# Patient Record
Sex: Female | Born: 1985
Health system: Southern US, Community
[De-identification: ages and names within clinical notes are randomized; demographics above are authoritative.]

## PROBLEM LIST (undated history)

## (undated) DIAGNOSIS — F1010 Alcohol abuse, uncomplicated: Secondary | ICD-10-CM

## (undated) DIAGNOSIS — I10 Essential (primary) hypertension: Secondary | ICD-10-CM

## (undated) DIAGNOSIS — K589 Irritable bowel syndrome without diarrhea: Secondary | ICD-10-CM

## (undated) DIAGNOSIS — F1910 Other psychoactive substance abuse, uncomplicated: Secondary | ICD-10-CM

## (undated) DIAGNOSIS — E119 Type 2 diabetes mellitus without complications: Secondary | ICD-10-CM

## (undated) DIAGNOSIS — F909 Attention-deficit hyperactivity disorder, unspecified type: Secondary | ICD-10-CM

## (undated) HISTORY — PX: LEEP: SHX91

## (undated) HISTORY — PX: APPENDECTOMY: SHX54

---
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
## 2005-02-17 ENCOUNTER — Inpatient Hospital Stay (HOSPITAL_COMMUNITY): Admission: AD | Admit: 2005-02-17 | Discharge: 2005-02-17 | Payer: Self-pay | Admitting: Family Medicine

## 2005-03-26 ENCOUNTER — Inpatient Hospital Stay (HOSPITAL_COMMUNITY): Admission: AD | Admit: 2005-03-26 | Discharge: 2005-03-26 | Payer: Self-pay | Admitting: Family Medicine

## 2005-04-04 ENCOUNTER — Other Ambulatory Visit: Admission: RE | Admit: 2005-04-04 | Discharge: 2005-04-04 | Payer: Self-pay | Admitting: Obstetrics and Gynecology

## 2005-07-24 ENCOUNTER — Inpatient Hospital Stay (HOSPITAL_COMMUNITY): Admission: AD | Admit: 2005-07-24 | Discharge: 2005-07-25 | Payer: Self-pay | Admitting: Obstetrics and Gynecology

## 2005-08-16 ENCOUNTER — Inpatient Hospital Stay (HOSPITAL_COMMUNITY): Admission: AD | Admit: 2005-08-16 | Discharge: 2005-08-16 | Payer: Self-pay | Admitting: Obstetrics and Gynecology

## 2005-08-29 ENCOUNTER — Emergency Department (HOSPITAL_COMMUNITY): Admission: EM | Admit: 2005-08-29 | Discharge: 2005-08-29 | Payer: Self-pay | Admitting: Emergency Medicine

## 2005-09-19 ENCOUNTER — Inpatient Hospital Stay (HOSPITAL_COMMUNITY): Admission: AD | Admit: 2005-09-19 | Discharge: 2005-09-21 | Payer: Self-pay | Admitting: Obstetrics and Gynecology

## 2005-10-30 ENCOUNTER — Other Ambulatory Visit: Admission: RE | Admit: 2005-10-30 | Discharge: 2005-10-30 | Payer: Self-pay | Admitting: Obstetrics and Gynecology

## 2006-09-05 ENCOUNTER — Other Ambulatory Visit: Admission: RE | Admit: 2006-09-05 | Discharge: 2006-09-05 | Payer: Self-pay | Admitting: Obstetrics and Gynecology

## 2006-09-26 ENCOUNTER — Inpatient Hospital Stay (HOSPITAL_COMMUNITY): Admission: AD | Admit: 2006-09-26 | Discharge: 2006-09-26 | Payer: Self-pay | Admitting: Obstetrics and Gynecology

## 2006-09-26 ENCOUNTER — Emergency Department (HOSPITAL_COMMUNITY): Admission: EM | Admit: 2006-09-26 | Discharge: 2006-09-26 | Payer: Self-pay | Admitting: Family Medicine

## 2006-12-31 ENCOUNTER — Inpatient Hospital Stay (HOSPITAL_COMMUNITY): Admission: AD | Admit: 2006-12-31 | Discharge: 2006-12-31 | Payer: Self-pay | Admitting: Obstetrics and Gynecology

## 2007-04-24 ENCOUNTER — Inpatient Hospital Stay (HOSPITAL_COMMUNITY): Admission: AD | Admit: 2007-04-24 | Discharge: 2007-04-25 | Payer: Self-pay | Admitting: Obstetrics and Gynecology

## 2007-04-30 ENCOUNTER — Inpatient Hospital Stay (HOSPITAL_COMMUNITY): Admission: RE | Admit: 2007-04-30 | Discharge: 2007-05-02 | Payer: Self-pay | Admitting: Obstetrics and Gynecology

## 2009-02-09 ENCOUNTER — Inpatient Hospital Stay (HOSPITAL_COMMUNITY): Admission: AD | Admit: 2009-02-09 | Discharge: 2009-02-09 | Payer: Self-pay | Admitting: Obstetrics and Gynecology

## 2009-02-15 ENCOUNTER — Inpatient Hospital Stay (HOSPITAL_COMMUNITY): Admission: AD | Admit: 2009-02-15 | Discharge: 2009-02-15 | Payer: Self-pay | Admitting: Obstetrics and Gynecology

## 2011-02-13 LAB — COMPREHENSIVE METABOLIC PANEL
AST: 19 U/L (ref 0–37)
Albumin: 4.1 g/dL (ref 3.5–5.2)
CO2: 25 mEq/L (ref 19–32)
Calcium: 9.4 mg/dL (ref 8.4–10.5)
Creatinine, Ser: 0.68 mg/dL (ref 0.4–1.2)
GFR calc Af Amer: >60 mL/min (ref >60)
GFR calc non Af Amer: >60 mL/min (ref >60)

## 2011-02-13 LAB — CBC
MCHC: 33 g/dL (ref 30.0–36.0)
MCV: 84.6 fL (ref 78.0–100.0)
Platelets: 277 10*3/uL (ref 150–400)

## 2011-02-13 LAB — HCG, QUANTITATIVE, PREGNANCY
hCG, Beta Chain, Quant, S: 251 m[IU]/mL — ABNORMAL HIGH (ref <5)
hCG, Beta Chain, Quant, S: 761 m[IU]/mL — ABNORMAL HIGH (ref <5)

## 2011-02-13 LAB — DIFFERENTIAL
Eosinophils Relative: 2 % (ref 0–5)
Lymphocytes Relative: 41 % (ref 12–46)
Lymphs Abs: 2.3 10*3/uL (ref 0.7–4.0)
Neutro Abs: 2.9 10*3/uL (ref 1.7–7.7)

## 2011-03-22 NOTE — Discharge Summary (Signed)
Janice Pierce, Janice Pierce          ACCOUNT NO.:  [id number]   MEDICAL RECORD NO.:  [id number]          PATIENT TYPE:  INP   LOCATION:  9121                          FACILITY:  [facility]   PHYSICIAN:  James A. Ashley Royalty, M.D.DATE OF BIRTH:  1986/08/05   DATE OF ADMISSION:  09/19/2005  DATE OF DISCHARGE:  09/21/2005                                 DISCHARGE SUMMARY   DISCHARGE DIAGNOSES:  1.  Intrauterine pregnancy at term, delivered.  2.  A-negative blood type.  3.  Term birth, living child, vertex.   OPERATIONS AND SPECIAL PROCEDURES:  OB delivery.   CONSULTATIONS:  None.   DISCHARGE MEDICATIONS:  Motrin 600 mg q.i.d. p.r.n.   HISTORY AND PHYSICAL:  This is a 25 year old primigravida, 37 weeks 1 day  gestation.  Patient is A-negative blood type and received RhoGAM at or about  28 weeks.  She presented to Maternity Admissions complaining of bleeding  like her period.  She was noted to be in labor with a cervical examination  of 3+ cm dilatation, 80% effaced, -1 to -2 station.  For the remainder of  the history and physical, please see chart.   HOSPITAL COURSE:  The patient was admitted to the [facility] at  [place].  Admission laboratory studies were drawn.  Artificial rupture of  membranes was accomplished.  The patient went on to labor and deliver on  September 20, 2005.  The delivery was accomplished by Dr. Gerald Leitz over an  intact perineum.  The infant was a 7 pound 14 ounce female.  Apgars 8 at one  minute, 9 at five minutes, sent to the newborn nursery.  The patient's  postpartum course was benign.  She requested discharge on the first  postpartum day and was granted same.  The discharge instructions were given.   DISPOSITION:  The patient is to return to [facility] and Obstetrics  in four to six weeks for postpartum evaluation.      James A. Ashley Royalty, M.D.  Electronically Signed     JAM/MEDQ  D:  10/24/2005  T:  10/24/2005  Job:  [id number]

## 2011-03-22 NOTE — Discharge Summary (Signed)
Janice Pierce, Janice Pierce             ACCOUNT NO.:  [id number]   MEDICAL RECORD NO.:  [id number]          PATIENT TYPE:  INP   LOCATION:  9123                          [facility]ILITY:  [facility]   PHYSICIAN:  James A. Ashley Royalty, M.D.DATE OF BIRTH:  1986-07-22   DATE OF ADMISSION:  04/30/2007  DATE OF DISCHARGE:  05/02/2007                               DISCHARGE SUMMARY   DISCHARGE DIAGNOSIS:  1. Intrauterine pregnancy at term, delivered.  2. Term birth of living child, vertex.  3. Large for gestational age infant.  4. Shoulder dystocia.   OPERATIONS AND SPECIAL PROCEDURES:  OB delivery with episiotomy (after  shoulder dystocia), episiorrhaphy.   CONSULTATIONS:  None.   DISCHARGE MEDICATIONS:  Percocet, Motrin 600 mg.   HISTORY AND PHYSICAL:  This is a 25 year old gravida 2, para 1 at [redacted]  weeks gestation.  Prenatal care was complicated by bipolar disorder, A  negative blood type, and large for gestational age infant.  Ultrasound  on April 23, 2007 revealed an estimated fetal weight of 4046 grams.  AFI  was 23.8.  The patient was admitted for induction secondary to LGA and  advanced cervical changes at term.  For the remainder of the history and  physical, please see chart.   HOSPITAL COURSE:  The patient was admitted to [facility]  [place].  Admission laboratory studies were drawn.  She was given  Pitocin, and artificial rupture of membranes was accomplished.  The  patient went on to labor and deliver on April 30, 2007.  The infant was a  9 pound 7 ounce female with Apgars 8 at 1 minute and 9 at 5 minutes, sent  to the newborn nursery.  Delivery was complicated by shoulder dystocia  which was relieved with the appropriate maneuvers.  The patient's  postpartum course was benign.  She was discharged on the second  postpartum day afebrile and in satisfactory condition.   DISPOSITION:  The patient is to return to [facility] and  Obstetrics in approximately 6 weeks for  postpartum evaluation.      James A. Ashley Royalty, M.D.  Electronically Signed     JAM/MEDQ  D:  07/09/2007  T:  07/09/2007  Job:  [id number]

## 2011-08-21 LAB — CBC
HCT: 30.7 — ABNORMAL LOW
Hemoglobin: 10.2 — ABNORMAL LOW
MCV: 76.5 — ABNORMAL LOW
RBC: 3.97
RBC: 4.26
RDW: 15.3 — ABNORMAL HIGH
WBC: 11.6 — ABNORMAL HIGH
WBC: 8.7

## 2011-08-21 LAB — RPR: RPR Ser Ql: NONREACTIVE

## 2015-06-16 ENCOUNTER — Emergency Department (HOSPITAL_COMMUNITY)
Admission: EM | Admit: 2015-06-16 | Discharge: 2015-06-16 | Disposition: A | Discharge status: Home / Self Care | Payer: Self-pay | Attending: Emergency Medicine | Admitting: Emergency Medicine

## 2015-06-16 ENCOUNTER — Emergency Department (HOSPITAL_COMMUNITY): Payer: Self-pay

## 2015-06-16 ENCOUNTER — Encounter (HOSPITAL_COMMUNITY): Payer: Self-pay | Admitting: Emergency Medicine

## 2015-06-16 DIAGNOSIS — M25561 Pain in right knee: Secondary | ICD-10-CM

## 2015-06-16 DIAGNOSIS — Y998 Other external cause status: Secondary | ICD-10-CM | POA: Insufficient documentation

## 2015-06-16 DIAGNOSIS — Z91040 Latex allergy status: Secondary | ICD-10-CM | POA: Insufficient documentation

## 2015-06-16 DIAGNOSIS — X58XXXA Exposure to other specified factors, initial encounter: Secondary | ICD-10-CM | POA: Insufficient documentation

## 2015-06-16 DIAGNOSIS — Z8719 Personal history of other diseases of the digestive system: Secondary | ICD-10-CM | POA: Insufficient documentation

## 2015-06-16 DIAGNOSIS — E119 Type 2 diabetes mellitus without complications: Secondary | ICD-10-CM | POA: Insufficient documentation

## 2015-06-16 DIAGNOSIS — Y92009 Unspecified place in unspecified non-institutional (private) residence as the place of occurrence of the external cause: Secondary | ICD-10-CM | POA: Insufficient documentation

## 2015-06-16 DIAGNOSIS — Z720 Tobacco use: Secondary | ICD-10-CM | POA: Insufficient documentation

## 2015-06-16 DIAGNOSIS — Z8659 Personal history of other mental and behavioral disorders: Secondary | ICD-10-CM | POA: Insufficient documentation

## 2015-06-16 DIAGNOSIS — S8001XA Contusion of right knee, initial encounter: Secondary | ICD-10-CM | POA: Insufficient documentation

## 2015-06-16 DIAGNOSIS — Y9389 Activity, other specified: Secondary | ICD-10-CM | POA: Insufficient documentation

## 2015-06-16 HISTORY — DX: Attention-deficit hyperactivity disorder, unspecified type: F90.9

## 2015-06-16 HISTORY — DX: Type 2 diabetes mellitus without complications: E11.9

## 2015-06-16 HISTORY — DX: Irritable bowel syndrome, unspecified: K58.9

## 2015-06-16 MED ORDER — NAPROXEN 500 MG PO TABS: 500.0000 mg | ORAL_TABLET | Freq: Two times a day (BID) | ORAL | Status: DC | PRN

## 2015-06-16 MED ORDER — HYDROCODONE-ACETAMINOPHEN 5-325 MG PO TABS
1.0000 | ORAL_TABLET | Freq: Once | ORAL | Status: AC
  Administered 2015-06-16: 1 via ORAL
  Filled 2015-06-16: qty 1

## 2015-06-16 MED ORDER — HYDROCODONE-ACETAMINOPHEN 5-325 MG PO TABS: 1.0000 | ORAL_TABLET | Freq: Four times a day (QID) | ORAL | Status: DC | PRN

## 2015-06-16 NOTE — ED Notes (Signed)
Per EMS pt at [employer] with children felt pop/pain to right knee post getting off of bouncer with children.

## 2015-06-16 NOTE — ED Provider Notes (Signed)
CSN: [id number]     Arrival date & time 06/16/15  1810 History  This chart was scribed for Richardean Canal, MD by Littie Deeds, ED Scribe. This patient was seen in room WTR6/WTR6 and the patient's care was started at 6:50 PM.       Chief Complaint  Patient presents with  . Knee Pain   Patient is a 29 y.o. female presenting with knee pain. The history is provided by the patient. No language interpreter was used.  Knee Pain Location:  Knee Time since incident:  1 hour Injury: yes   Mechanism of injury comment:  Foot slid out and knee bent sideways Knee location:  R knee Pain details:    Quality:  Throbbing and shooting   Radiates to:  R leg   Severity:  Severe   Onset quality:  Sudden   Duration:  1 hour   Timing:  Constant   Progression:  Unchanged Chronicity:  New Dislocation: no   Prior injury to area:  No Relieved by:  None tried Worsened by:  Bearing weight Ineffective treatments:  None tried Associated symptoms: decreased ROM (due to pain), swelling and tingling (in lower R leg)   Associated symptoms: no back pain, no muscle weakness, no neck pain and no numbness    HPI Comments: Janice Pierce is a 29 y.o. female who presents to the Emergency Department complaining of sudden onset, constant, shooting, throbbing right knee pain radiating down her leg intermittently that occurred prior to arrival. Patient states she was in a bounce house when her right foot slid outwards and caused her knee to bend sideways; she felt a pop when this happened. The pain is described as a 10/10 in severity. The pain is worsened with movement of her leg and foot. No tx tried PTA. She does not have any abrasions, but she does have bruising and swelling to the R knee. She reports having associated tingling to the R lower leg. Patient denies head injury and LOC, chest pain, SOB, headache, nausea, vomiting, abdominal pain, neck pain, back pain, numbness, focal weakness, or any other injuries.   Past  Medical History  Diagnosis Date  . Diabetes mellitus without complication   . IBS (irritable bowel syndrome)   . ADHD (attention deficit hyperactivity disorder)    History reviewed. No pertinent past surgical history. No family history on file. Social History  Substance Use Topics  . Smoking status: Current Every Day Smoker -- 0.50 packs/day    Types: Cigarettes  . Smokeless tobacco: None  . Alcohol Use: Yes     Comment: social   OB History    No data available     Review of Systems  HENT: Negative for facial swelling (no head inj).   Respiratory: Negative for shortness of breath.   Cardiovascular: Negative for chest pain.  Gastrointestinal: Negative for nausea, vomiting and abdominal pain.  Musculoskeletal: Positive for joint swelling (R knee) and arthralgias (R knee). Negative for back pain and neck pain.  Skin: Positive for color change (bruising to R knee). Negative for wound.  Allergic/Immunologic: Negative for immunocompromised state.  Neurological: Negative for syncope and headaches.  10 Systems reviewed and are negative for acute change except as noted in the HPI.     Allergies  Latex  Home Medications   Prior to Admission medications   Not on File   BP 153/100 mmHg  Pulse 80  Temp(Src) 98.5 F (36.9 C) (Oral)  Resp 22  SpO2 100% Physical Exam  Constitutional: She is oriented to person, place, and time. Vital signs are normal. She appears well-developed and well-nourished.  Non-toxic appearance. She appears distressed.  Afebrile, nontoxic, appears uncomfortable and is tearful during exam  HENT:  Head: Normocephalic and atraumatic.  Mouth/Throat: Mucous membranes are normal.  Eyes: Conjunctivae and EOM are normal. Right eye exhibits no discharge. Left eye exhibits no discharge.  Neck: Normal range of motion. Neck supple.  Cardiovascular: Normal rate and intact distal pulses.   Pulmonary/Chest: Effort normal. No respiratory distress.  Abdominal: Normal  appearance. She exhibits no distension.  Musculoskeletal:       Right knee: She exhibits decreased range of motion (due to pain), swelling, ecchymosis and bony tenderness. She exhibits no deformity, no laceration, no erythema, normal alignment, no LCL laxity, normal patellar mobility and no MCL laxity. Tenderness found. Medial joint line and lateral joint line tenderness noted.       Right lower leg: She exhibits tenderness and bony tenderness. She exhibits no swelling, no deformity and no laceration.       Legs: R knee with limited ROM due to pain, mild moderate medial and lateral joint line TTP extending into anterior tibia area of lower leg, slight swelling to R knee noted, no deformity, small amount of bruising noted laterally along joint line, no erythema or warmth, no abnormal alignment or patellar mobility, no varus/valgus laxity, neg anterior drawer test, no crepitus. Strength slightly diminished due to pain. Sensation grossly intact, wiggles toes without difficulty, distal pulses intact. R ankle nonTTP without deformities or swelling.    Neurological: She is alert and oriented to person, place, and time. She has normal strength. No sensory deficit.  Skin: Skin is warm, dry and intact. Bruising noted. No abrasion and no rash noted.  Small bruise to R knee. No abrasions over all exposed surfaces  Psychiatric: She has a normal mood and affect. Her behavior is normal.  Nursing note and vitals reviewed.   ED Course  Procedures  DIAGNOSTIC STUDIES: Oxygen Saturation is 100% on room air, normal by my interpretation.    COORDINATION OF CARE: 6:55 PM-Discussed treatment plan which includes XR imaging and pain medication with patient/guardian at bedside and patient/guardian agreed to plan.    Labs Review Labs Reviewed - No data to display  Imaging Review No results found. I personally reviewed and evaluated these images and lab results as part of my medical decision-making.   EKG  Interpretation None      MDM   Final diagnoses:  Right knee pain    29 y.o. female here with R knee pain, swelling, and bruising after her leg bent out from under her just PTA. Slight swelling and bruising noted, with tenderness to jointline bilaterally. Mild tenderness to tibia anteriorly. Will obtain xray imaging. NVI with soft compartments. No ankle tenderness. Will give pain meds and reassess shortly.   8:01 PM Xray still pending. Elsie Stain will follow up with this, please see her note for further documentation.  I personally performed the services described in this documentation, which was scribed in my presence. The recorded information has been reviewed and is accurate.  BP 153/100 mmHg  Pulse 80  Temp(Src) 98.5 F (36.9 C) (Oral)  Resp 22  SpO2 100%  Meds ordered this encounter  Medications  . HYDROcodone-acetaminophen (NORCO/VICODIN) 5-325 MG per tablet 1 tablet    Sig:   . naproxen (NAPROSYN) 500 MG tablet    Sig: Take 1 tablet (500 mg total) by mouth 2 (two)  times daily as needed for mild pain, moderate pain or headache (TAKE WITH MEALS.).    Dispense:  20 tablet    Refill:  0    Order Specific Question:  Supervising Provider    Answer:  MILLER, BRIAN [[id number]]  . HYDROcodone-acetaminophen (NORCO) 5-325 MG per tablet    Sig: Take 1 tablet by mouth every 6 (six) hours as needed for severe pain.    Dispense:  10 tablet    Refill:  0    Order Specific Question:  Supervising Provider    Answer:  Eber Hong [[id number]]     Brittan Mapel Camprubi-Soms, PA-C 06/16/15 2002  Richardean Canal, MD 06/16/15 2314

## 2015-06-16 NOTE — Discharge Instructions (Signed)
Wear knee immobilizer for at least 2 weeks for stabilization of knee. Use crutches as needed for comfort. Ice and elevate knee throughout the day. Alternate between naprosyn and norco for pain relief. Do not drive or operate machinery with pain medication use. Call orthopedic follow up today or tomorrow to schedule followup appointment for recheck of ongoing knee pain in one week. Return to the ER for changes or worsening symptoms.   Knee Pain Knee pain can be a result of an injury or other medical conditions. Treatment will depend on the cause of your pain. HOME CARE  Only take medicine as told by your doctor.  Keep a healthy weight. Being overweight can make the knee hurt more.  Stretch before exercising or playing sports.  If there is constant knee pain, change the way you exercise. Ask your doctor for advice.  Make sure shoes fit well. Choose the right shoe for the sport or activity.  Protect your knees. Wear kneepads if needed.  Rest when you are tired. GET HELP RIGHT AWAY IF:   Your knee pain does not stop.  Your knee pain does not get better.  Your knee joint feels hot to the touch.  You have a fever. MAKE SURE YOU:   Understand these instructions.  Will watch this condition.  Will get help right away if you are not doing well or get worse. Document Released: 01/17/2009 Document Revised: 01/13/2012 Document Reviewed: 01/17/2009 [facility] Patient Information 2015 [place], [place]. This information is not intended to replace advice given to you by your health care provider. Make sure you discuss any questions you have with your health care provider.  Cryotherapy Cryotherapy is when you put ice on your injury. Ice helps lessen pain and puffiness (swelling) after an injury. Ice works the best when you start using it in the first 24 to 48 hours after an injury. HOME CARE  Put a dry or damp towel between the ice pack and your skin.  You may press gently on the ice  pack.  Leave the ice on for no more than 10 to 20 minutes at a time.  Check your skin after 5 minutes to make sure your skin is okay.  Rest at least 20 minutes between ice pack uses.  Stop using ice when your skin loses feeling (numbness).  Do not use ice on someone who cannot tell you when it hurts. This includes small children and people with memory problems (dementia). GET HELP RIGHT AWAY IF:  You have white spots on your skin.  Your skin turns blue or pale.  Your skin feels waxy or hard.  Your puffiness gets worse. MAKE SURE YOU:   Understand these instructions.  Will watch your condition.  Will get help right away if you are not doing well or get worse. Document Released: 04/08/2008 Document Revised: 01/13/2012 Document Reviewed: 06/13/2011 [facility] Patient Information 2015 [place], [place]. This information is not intended to replace advice given to you by your health care provider. Make sure you discuss any questions you have with your health care provider.   Your xray is normal

## 2020-08-16 ENCOUNTER — Ambulatory Visit: Payer: BLUE CROSS/BLUE SHIELD

## 2020-08-16 ENCOUNTER — Other Ambulatory Visit: Payer: Self-pay

## 2020-08-16 ENCOUNTER — Ambulatory Visit
Admission: EM | Admit: 2020-08-16 | Discharge: 2020-08-16 | Disposition: A | Discharge status: Home / Self Care | Payer: BLUE CROSS/BLUE SHIELD

## 2020-08-16 ENCOUNTER — Encounter: Payer: Self-pay | Admitting: Emergency Medicine

## 2020-08-16 ENCOUNTER — Ambulatory Visit (INDEPENDENT_AMBULATORY_CARE_PROVIDER_SITE_OTHER): Payer: BLUE CROSS/BLUE SHIELD

## 2020-08-16 DIAGNOSIS — S62396A Other fracture of fifth metacarpal bone, right hand, initial encounter for closed fracture: Secondary | ICD-10-CM | POA: Diagnosis not present

## 2020-08-16 DIAGNOSIS — M79641 Pain in right hand: Secondary | ICD-10-CM

## 2020-08-16 MED ORDER — IBUPROFEN 800 MG PO TABS: 800.0000 mg | ORAL_TABLET | Freq: Three times a day (TID) | ORAL | 0 refills | Status: AC

## 2020-08-16 NOTE — ED Provider Notes (Signed)
[facility]   [id number] 08/16/20 Arrival Time: 1656   Chief Complaint  Patient presents with  . Hand Pain     SUBJECTIVE: History from: patient and family.  Janice Pierce is a 34 y.o. female presented to the urgent care with a complaint of right hand pain that occurred 4 days ago.  Reported she hit the wall today and get her symptom worse. She localizes the pain to the right hand.  She describes the pain as constant and achy.  She has tried OTC medications without relief.  Her symptoms are made worse with ROM.  She denies similar symptoms in the past.  Denies chills, fever, nausea, vomiting, diarrhea.   ROS: As per HPI.  All other pertinent ROS negative.     Past Medical History:  Diagnosis Date  . ADHD (attention deficit hyperactivity disorder)   . Diabetes mellitus without complication (HCC)   . IBS (irritable bowel syndrome)    History reviewed. No pertinent surgical history. Allergies  Allergen Reactions  . Latex Hives   No current facility-administered medications on file prior to encounter.   Current Outpatient Medications on File Prior to Encounter  Medication Sig Dispense Refill  . amphetamine-dextroamphetamine (ADDERALL XR) 20 MG 24 hr capsule Take 20 mg by mouth daily.    . DULoxetine (CYMBALTA) 60 MG capsule Take 60 mg by mouth daily.    Marland Kitchen albuterol (VENTOLIN HFA) 108 (90 Base) MCG/ACT inhaler Inhale into the lungs.    Marland Kitchen atorvastatin (LIPITOR) 20 MG tablet Take 20 mg by mouth daily.    Marland Kitchen buPROPion (WELLBUTRIN XL) 150 MG 24 hr tablet Take 150 mg by mouth at bedtime.    . Continuous Blood Gluc Sensor (FREESTYLE LIBRE 14 DAY SENSOR) MISC Apply topically.    Marland Kitchen gentamicin (GARAMYCIN) 0.3 % ophthalmic solution     . HYDROcodone-acetaminophen (NORCO) 5-325 MG per tablet Take 1 tablet by mouth every 6 (six) hours as needed for severe pain. 10 tablet 0  . naproxen (NAPROSYN) 500 MG tablet Take 1 tablet (500 mg total) by mouth 2 (two) times daily as needed  for mild pain, moderate pain or headache (TAKE WITH MEALS.). 20 tablet 0  . traZODone (DESYREL) 50 MG tablet Take 100 mg by mouth at bedtime.     Social History   Socioeconomic History  . Marital status: Legally Separated    Spouse name: Not on file  . Number of children: Not on file  . Years of education: Not on file  . Highest education level: Not on file  Occupational History  . Not on file  Tobacco Use  . Smoking status: Current Every Day Smoker    Packs/day: 0.50    Types: Cigarettes  Substance and Sexual Activity  . Alcohol use: Yes    Comment: social  . Drug use: No  . Sexual activity: Not on file  Other Topics Concern  . Not on file  Social History Narrative  . Not on file   Social Determinants of Health   Financial Resource Strain:   . Difficulty of Paying Living Expenses: Not on file  Food Insecurity:   . Worried About Programme researcher, broadcasting/film/video in the Last Year: Not on file  . Ran Out of Food in the Last Year: Not on file  Transportation Needs:   . Lack of Transportation (Medical): Not on file  . Lack of Transportation (Non-Medical): Not on file  Physical Activity:   . Days of Exercise per Week: Not on file  .  Minutes of Exercise per Session: Not on file  Stress:   . Feeling of Stress : Not on file  Social Connections:   . Frequency of Communication with Friends and Family: Not on file  . Frequency of Social Gatherings with Friends and Family: Not on file  . Attends Religious Services: Not on file  . Active Member of Clubs or Organizations: Not on file  . Attends Banker Meetings: Not on file  . Marital Status: Not on file  Intimate Partner Violence:   . Fear of Current or Ex-Partner: Not on file  . Emotionally Abused: Not on file  . Physically Abused: Not on file  . Sexually Abused: Not on file   No family history on file.  OBJECTIVE:  Vitals:   08/16/20 1714  BP: (!) 156/94  Pulse: 73  Resp: 17  Temp: 98.2 F (36.8 C)  TempSrc: Oral   SpO2: 97%     Physical Exam Vitals and nursing note reviewed.  Constitutional:      General: She is not in acute distress.    Appearance: Normal appearance. She is normal weight. She is not ill-appearing, toxic-appearing or diaphoretic.  HENT:     Head: Normocephalic.  Cardiovascular:     Rate and Rhythm: Normal rate and regular rhythm.     Pulses: Normal pulses.     Heart sounds: Normal heart sounds. No murmur heard.  No friction rub. No gallop.   Pulmonary:     Effort: Pulmonary effort is normal. No respiratory distress.     Breath sounds: Normal breath sounds. No stridor. No wheezing, rhonchi or rales.  Chest:     Chest wall: No tenderness.  Musculoskeletal:        General: Tenderness present.     Right hand: Swelling and tenderness present.     Left hand: Normal.     Comments: The right hand is with obvious deformity compared to the left hand.  No ecchymosis, open wound, surface trauma, lesion, or warmth present.  Limited range of motion due to pain and swelling.  Neurovascular status intact.  Neurological:     Mental Status: She is alert and oriented to person, place, and time.      LABS:  No results found for this or any previous visit (from the past 24 hour(s)).   RADIOLOGY:  DG Hand Complete Right  Result Date: 08/16/2020 CLINICAL DATA:  34 year old female with right hand pain. EXAM: RIGHT HAND - COMPLETE 3+ VIEW COMPARISON:  None. FINDINGS: There is a nondisplaced fracture of the fifth metacarpal head with mild volar angulation. No other acute fracture identified. There is no dislocation. The bones are well mineralized. Mild soft tissue swelling over the fifth metacarpal. No radiopaque foreign object or soft tissue gas. IMPRESSION: Mildly angulated fracture of the fifth metacarpal head. Electronically Signed   By: Elgie Collard M.D.   On: 08/16/2020 17:45   X-ray is positive for angulated fracture of the fifth metacarpal head.  I have reviewed the x-ray myself  and the radiologist interpretation.  I am in agreement with the radiologist interpretation.   ASSESSMENT & PLAN:  1. Right hand pain   2. Other closed fracture of fifth metacarpal bone of right hand, initial encounter     Meds ordered this encounter  Medications  . ibuprofen (ADVIL) 800 MG tablet    Sig: Take 1 tablet (800 mg total) by mouth 3 (three) times daily.    Dispense:  30 tablet  Refill:  0    Discharge instructions  Alternate Tylenol/ibuprofen as needed for pain Follow-up with PCP/orthopedic Follow RICE instruction that is attached Return or go to ED for worsening symptoms   Reviewed expectations re: course of current medical issues. Questions answered. Outlined signs and symptoms indicating need for more acute intervention. Patient verbalized understanding. After Visit Summary given.         Durward Parcel, FNP 08/16/20 1802

## 2020-08-16 NOTE — Discharge Instructions (Signed)
Alternate Tylenol/ibuprofen as needed for pain Follow-up with PCP/orthopedic Follow RICE instruction that is attached Return or go to ED for worsening symptoms

## 2020-08-16 NOTE — ED Triage Notes (Signed)
Pt reports right hand pain after hitting the stud in a wall.

## 2021-02-21 ENCOUNTER — Emergency Department (HOSPITAL_COMMUNITY): Payer: BLUE CROSS/BLUE SHIELD

## 2021-02-21 ENCOUNTER — Encounter (HOSPITAL_COMMUNITY): Payer: Self-pay

## 2021-02-21 ENCOUNTER — Emergency Department (HOSPITAL_COMMUNITY)
Admission: EM | Admit: 2021-02-21 | Discharge: 2021-02-21 | Discharge status: Against Medical Advice | Payer: BLUE CROSS/BLUE SHIELD | Attending: Emergency Medicine | Admitting: Emergency Medicine

## 2021-02-21 ENCOUNTER — Other Ambulatory Visit: Payer: Self-pay

## 2021-02-21 DIAGNOSIS — R0789 Other chest pain: Secondary | ICD-10-CM | POA: Diagnosis present

## 2021-02-21 DIAGNOSIS — Z91040 Latex allergy status: Secondary | ICD-10-CM | POA: Diagnosis not present

## 2021-02-21 DIAGNOSIS — Z5321 Procedure and treatment not carried out due to patient leaving prior to being seen by health care provider: Secondary | ICD-10-CM

## 2021-02-21 DIAGNOSIS — R202 Paresthesia of skin: Secondary | ICD-10-CM | POA: Diagnosis not present

## 2021-02-21 DIAGNOSIS — F17210 Nicotine dependence, cigarettes, uncomplicated: Secondary | ICD-10-CM | POA: Diagnosis not present

## 2021-02-21 DIAGNOSIS — R519 Headache, unspecified: Secondary | ICD-10-CM | POA: Diagnosis not present

## 2021-02-21 DIAGNOSIS — R4781 Slurred speech: Secondary | ICD-10-CM | POA: Diagnosis not present

## 2021-02-21 DIAGNOSIS — R0602 Shortness of breath: Secondary | ICD-10-CM | POA: Insufficient documentation

## 2021-02-21 DIAGNOSIS — I10 Essential (primary) hypertension: Secondary | ICD-10-CM | POA: Diagnosis not present

## 2021-02-21 DIAGNOSIS — F419 Anxiety disorder, unspecified: Secondary | ICD-10-CM | POA: Diagnosis not present

## 2021-02-21 DIAGNOSIS — Z79899 Other long term (current) drug therapy: Secondary | ICD-10-CM | POA: Diagnosis not present

## 2021-02-21 DIAGNOSIS — E119 Type 2 diabetes mellitus without complications: Secondary | ICD-10-CM | POA: Diagnosis not present

## 2021-02-21 HISTORY — DX: Other psychoactive substance abuse, uncomplicated: F19.10

## 2021-02-21 HISTORY — DX: Essential (primary) hypertension: I10

## 2021-02-21 HISTORY — DX: Alcohol abuse, uncomplicated: F10.10

## 2021-02-21 LAB — COMPREHENSIVE METABOLIC PANEL
ALT: 14 U/L (ref 0–44)
AST: 15 U/L (ref 15–41)
Albumin: 4.1 g/dL (ref 3.5–5.0)
Alkaline Phosphatase: 60 U/L (ref 38–126)
Anion gap: 12 (ref 5–15)
BUN: 16 mg/dL (ref 6–20)
CO2: 23 mmol/L (ref 22–32)
Calcium: 9.4 mg/dL (ref 8.9–10.3)
Chloride: 100 mmol/L (ref 98–111)
Creatinine, Ser: 0.7 mg/dL (ref 0.44–1.00)
GFR, Estimated: >60 mL/min (ref >60)
Glucose, Bld: 279 mg/dL — ABNORMAL HIGH (ref 70–99)
Potassium: 3.3 mmol/L — ABNORMAL LOW (ref 3.5–5.1)
Sodium: 135 mmol/L (ref 135–145)
Total Bilirubin: 0.8 mg/dL (ref 0.3–1.2)
Total Protein: 7.3 g/dL (ref 6.5–8.1)

## 2021-02-21 LAB — CBC
HCT: 42.9 % (ref 36.0–46.0)
Hemoglobin: 14.2 g/dL (ref 12.0–15.0)
MCH: 27.8 pg (ref 26.0–34.0)
MCHC: 33.1 g/dL (ref 30.0–36.0)
MCV: 84.1 fL (ref 80.0–100.0)
Platelets: 300 10*3/uL (ref 150–400)
RBC: 5.1 MIL/uL (ref 3.87–5.11)
RDW: 11.9 % (ref 11.5–15.5)
WBC: 7.9 10*3/uL (ref 4.0–10.5)
nRBC: 0 % (ref 0.0–0.2)

## 2021-02-21 LAB — TROPONIN I (HIGH SENSITIVITY)
Troponin I (High Sensitivity): 3 ng/L (ref <18)
Troponin I (High Sensitivity): 3 ng/L (ref <18)

## 2021-02-21 LAB — RAPID URINE DRUG SCREEN, HOSP PERFORMED
Amphetamines: POSITIVE — AB
Barbiturates: NOT DETECTED
Benzodiazepines: NOT DETECTED
Cocaine: NOT DETECTED
Opiates: NOT DETECTED
Tetrahydrocannabinol: NOT DETECTED

## 2021-02-21 NOTE — ED Notes (Addendum)
Pt left AMA, IV removed. [place] PA aware

## 2021-02-21 NOTE — ED Notes (Signed)
Pt at doorway asking RN to remove IV, pt stating she wants to leave. PA and MD aware

## 2021-02-21 NOTE — ED Triage Notes (Signed)
Went to urgent care for HTN they sent pt here for ETOH and substance withdrawal.  States she just wants help

## 2021-02-21 NOTE — ED Provider Notes (Signed)
[facility] EMERGENCY DEPARTMENT Provider Note   CSN: [id number] Arrival date & time: 02/21/21  1303     History Chief Complaint  Patient presents with  . Withdrawal    Janice Pierce is a 35 y.o. female ADHD, alcohol use, polysubstance use, diabetes mellitus, hypertension.  She reports that she is noncompliant with any medications.  Patient presents with chief complaint of chest tightness, shortness of breath, and right-sided facial numbness.  Reports that her facial numbness started 0445 this morning.  Patient reports starting after having bright headlights shined in her eyes.  Patient reports this numbness lasted for approximately an hour and then resolved spontaneously.  Patient denies any associated slurred speech or facial drooping at that time.  Patient reports that she has had headache intermittently over the last 2 days.  She reports history of migraine headaches.  Patient states that headache she has been experiencing is nonproductive her life.  They have had gradual onset and pain has progressively worsened over time.  Patient reports that her chest tightness and shortness of breath started at 0445 this morning.  Patient reports that this sensation lasted up until 1200.  She denies any chest pain.  Reported tightness was throughout her entire chest.  Denies any alleviating or aggravating factors.  Patient reports that she has a history of anxiety and states that this chest tightness and shortness of breath felt different than anything she had experienced before.  Patient reports history of daily alcohol use.  Patient states she normally drinks 8-12 beers daily.  Patient has not had any alcoholic beverages in the last 2 days.  Patient endorses using cocaine 4 days prior.  Patient endorses cigarette and vape use.  Patient denies any fevers, chills, palpitations, leg swelling, abdominal pain, nausea, vomiting, diaphoresis, seizure, lightheadedness, syncopal  episode, dizziness.  Patient denies any recent falls or traumatic injuries.  HPI     Past Medical History:  Diagnosis Date  . ADHD (attention deficit hyperactivity disorder)   . Alcohol abuse   . Diabetes mellitus without complication (HCC)   . Hypertension   . IBS (irritable bowel syndrome)   . Substance abuse (HCC)     There are no problems to display for this patient.   Past Surgical History:  Procedure Laterality Date  . APPENDECTOMY    . LEEP       OB History   No obstetric history on file.     No family history on file.  Social History   Tobacco Use  . Smoking status: Current Every Day Smoker    Packs/day: 0.50    Types: Cigarettes  . Smokeless tobacco: Current User  Substance Use Topics  . Alcohol use: Yes    Alcohol/week: 34.0 standard drinks    Types: 34 Cans of beer per week    Comment: everyday  . Drug use: Yes    Types: Cocaine, Marijuana, Methamphetamines    Home Medications Prior to Admission medications   Medication Sig Start Date End Date Taking? Authorizing Provider  albuterol (VENTOLIN HFA) 108 (90 Base) MCG/ACT inhaler Inhale into the lungs. 08/05/20   [provider]  amphetamine-dextroamphetamine (ADDERALL XR) 20 MG 24 hr capsule Take 20 mg by mouth daily.    [provider]  atorvastatin (LIPITOR) 20 MG tablet Take 20 mg by mouth daily. 05/11/20   [provider]  buPROPion (WELLBUTRIN XL) 150 MG 24 hr tablet Take 150 mg by mouth at bedtime. 06/25/20   [provider]  Continuous Blood Gluc Sensor (FREESTYLE LIBRE 14 DAY SENSOR) MISC Apply topically. 07/21/20   [provider]  DULoxetine (CYMBALTA) 60 MG capsule Take 60 mg by mouth daily.    [provider]  gentamicin (GARAMYCIN) 0.3 % ophthalmic solution  06/17/20   [provider]  HYDROcodone-acetaminophen (NORCO) 5-325 MG per tablet Take 1 tablet by mouth every 6 (six) hours as needed for severe pain. 06/16/15   Street,  Mentor, PA-C  ibuprofen (ADVIL) 800 MG tablet Take 1 tablet (800 mg total) by mouth 3 (three) times daily. 08/16/20   Avegno, Zachery Dakins, FNP  naproxen (NAPROSYN) 500 MG tablet Take 1 tablet (500 mg total) by mouth 2 (two) times daily as needed for mild pain, moderate pain or headache (TAKE WITH MEALS.). 06/16/15   Street, Mercedes, PA-C  traZODone (DESYREL) 50 MG tablet Take 100 mg by mouth at bedtime.    [provider]    Allergies    Latex  Review of Systems   Review of Systems  Constitutional: Negative for chills, diaphoresis and fever.  Eyes: Negative for visual disturbance.  Respiratory: Positive for chest tightness and shortness of breath.   Cardiovascular: Negative for chest pain, palpitations and leg swelling.  Gastrointestinal: Negative for abdominal pain, nausea and vomiting.  Genitourinary: Negative for difficulty urinating, dysuria, hematuria, vaginal bleeding, vaginal discharge and vaginal pain.  Musculoskeletal: Negative for back pain and neck pain.  Skin: Negative for color change and rash.  Neurological: Positive for numbness and headaches. Negative for dizziness, tremors, seizures, syncope, facial asymmetry, speech difficulty, weakness and light-headedness.  Psychiatric/Behavioral: Negative for confusion, hallucinations, self-injury and suicidal ideas.    Physical Exam Updated Vital Signs BP (!) 133/108   Pulse 79   Temp 97.9 F (36.6 C) (Oral)   Resp 15   Wt 78 kg   SpO2 100%   Physical Exam Vitals and nursing note reviewed.  Constitutional:      General: She is not in acute distress.    Appearance: She is not ill-appearing, toxic-appearing or diaphoretic.  HENT:     Head: Normocephalic and atraumatic. No raccoon eyes, abrasion, contusion, masses, right periorbital erythema, left periorbital erythema or laceration.     Jaw: No trismus or pain on movement.     Mouth/Throat:     Lips: Pink.     Mouth: Mucous membranes are moist.     Pharynx:  Oropharynx is clear. Uvula midline. No pharyngeal swelling, oropharyngeal exudate, posterior oropharyngeal erythema or uvula swelling.  Eyes:     General: No scleral icterus.       Right eye: No discharge.        Left eye: No discharge.     Extraocular Movements: Extraocular movements intact.     Pupils: Pupils are equal, round, and reactive to light.  Cardiovascular:     Rate and Rhythm: Normal rate.     Heart sounds: Normal heart sounds.  Pulmonary:     Effort: Pulmonary effort is normal. No tachypnea, bradypnea or respiratory distress.     Breath sounds: Normal breath sounds. No stridor.  Abdominal:     Palpations: Abdomen is soft.     Tenderness: There is no abdominal tenderness.  Musculoskeletal:     Cervical back: Normal range of motion and neck supple. No rigidity.     Right lower leg: No swelling, tenderness or bony tenderness. No edema.     Left lower leg: No tenderness or bony tenderness. No edema.  Skin:  General: Skin is warm and dry.     Coloration: Skin is not jaundiced or pale.     Findings: No erythema.  Neurological:     General: No focal deficit present.     Mental Status: She is alert and oriented to person, place, and time.     GCS: GCS eye subscore is 4. GCS verbal subscore is 5. GCS motor subscore is 6.     Cranial Nerves: No cranial nerve deficit or facial asymmetry.     Sensory: Sensation is intact.     Motor: No weakness, tremor, seizure activity or pronator drift.     Coordination: Finger-Nose-Finger Test normal.     Gait: Gait is intact. Gait normal.     Comments: CN II-XII intact, equal grip strength, +5 strength to bilateral upper and lower extremities    Psychiatric:        Attention and Perception: She is attentive. She does not perceive auditory or visual hallucinations.        Mood and Affect: Mood is anxious.        Behavior: Behavior is cooperative.        Thought Content: Thought content does not include homicidal or suicidal ideation.  Thought content does not include homicidal or suicidal plan.     ED Results / Procedures / Treatments   Labs (all labs ordered are listed, but only abnormal results are displayed) Labs Reviewed  COMPREHENSIVE METABOLIC PANEL  CBC  ETHANOL  RAPID URINE DRUG SCREEN, HOSP PERFORMED  TROPONIN I (HIGH SENSITIVITY)    EKG EKG Interpretation  Date/Time:  Wednesday February 21 2021 13:16:25 EDT Ventricular Rate:  76 PR Interval:  159 QRS Duration: 123 QT Interval:  421 QTC Calculation: 474 R Axis:   -16 Text Interpretation: Sinus or ectopic atrial rhythm Nonspecific intraventricular conduction delay No old tracing to compare Confirmed by Mancel BaleWentz, Elliott [phone]([id number]) on 02/21/2021 2:20:42 PM   Radiology DG Chest Port 1 View  Result Date: 02/21/2021 CLINICAL DATA:  Chest pain. EXAM: PORTABLE CHEST 1 VIEW COMPARISON:  PA and lateral chest 11/10/2020. FINDINGS: Lungs clear. Heart size normal. No pneumothorax or pleural fluid. No acute or focal bony abnormality. IMPRESSION: Negative chest. Electronically Signed   By: Drusilla Kannerhomas  Dalessio M.D.   On: 02/21/2021 14:46    Procedures Procedures   Medications Ordered in ED Medications - No data to display  ED Course  I have reviewed the triage vital signs and the nursing notes.  Pertinent labs & imaging results that were available during my care of the patient were reviewed by me and considered in my medical decision making (see chart for details).    MDM Rules/Calculators/A&P                          Alert 35 year old female appears anxious, in no acute distress, nontoxic-appearing.  Patient presents with chief complaint of chest tightness, shortness of breath, right-sided facial numbness, and sensation of daily alcohol consumption.  Patient reports that her right-sided facial numbness occurred this morning.  Started after bright lights shined in her eyes.  Lasted for approximately 1 hour.  Patient denies any associated slurred speech or facial  drooping during this time period.  Resolved spontaneously.  On physical exam patient has no focal neurological deficits.  Low suspicion for CVA or TIA.  Suspect this may have been associated with patient's reported history of migraines.  Patient reports chest tightness and shortness of breath that began this  morning.  Chest tightness and shortness of breath were constant until approximately noon.  Patient stated this pain felt different than how she normally feels with anxiety or panic attacks.  Concern for possible ACS.  Patient has risk factors of hypertension, hyperlipidemia, diabetes, and obesity.  Patient is my compliant with her medications.  We will obtain chest x-ray, EKG, troponin, CBC, CMP.  EKG shows Sinus or ectopic atrial rhythm Nonspecific intraventricular conduction delay. Chest x-ray shows no active cardiopulmonary disease.  17 Advised by RN that patient was becoming agitated and wanting to leave.  Patient reports she is feeling very anxious.  Patient was offered Ativan and nicotine patch.  Patient refused these interventions.  Discussed with patient lab work was pending for her chest tightness and shortness of breath.  Without waiting for her lab work we cannot tell if she is having any heart attack today.  If she left before her lab work was completed there is a chance that she could have worsening of her symptoms and if she was having a heart attack she could die without treatment..   Patient reports understanding risk of leaving AGAINST MEDICAL ADVICE.  Patient left AGAINST MEDICAL ADVICE.      Final Clinical Impression(s) / ED Diagnoses Final diagnoses:  Chest tightness  Eloped from emergency department    Rx / DC Orders ED Discharge Orders    None       Berneice Heinrich 02/21/21 [id number]    Mancel Bale, MD 02/22/21 [phone]

## 2021-02-21 NOTE — ED Triage Notes (Signed)
Dizziness, blurred vision, palpitations

## 2022-09-13 IMAGING — DX DG CHEST 1V PORT
1 series · 1 of 1 positions shown · non-contrast
Comparison: PA and lateral chest 11/10/2020.

CLINICAL DATA: Chest pain.

EXAM:
PORTABLE CHEST 1 VIEW

[chest ap]
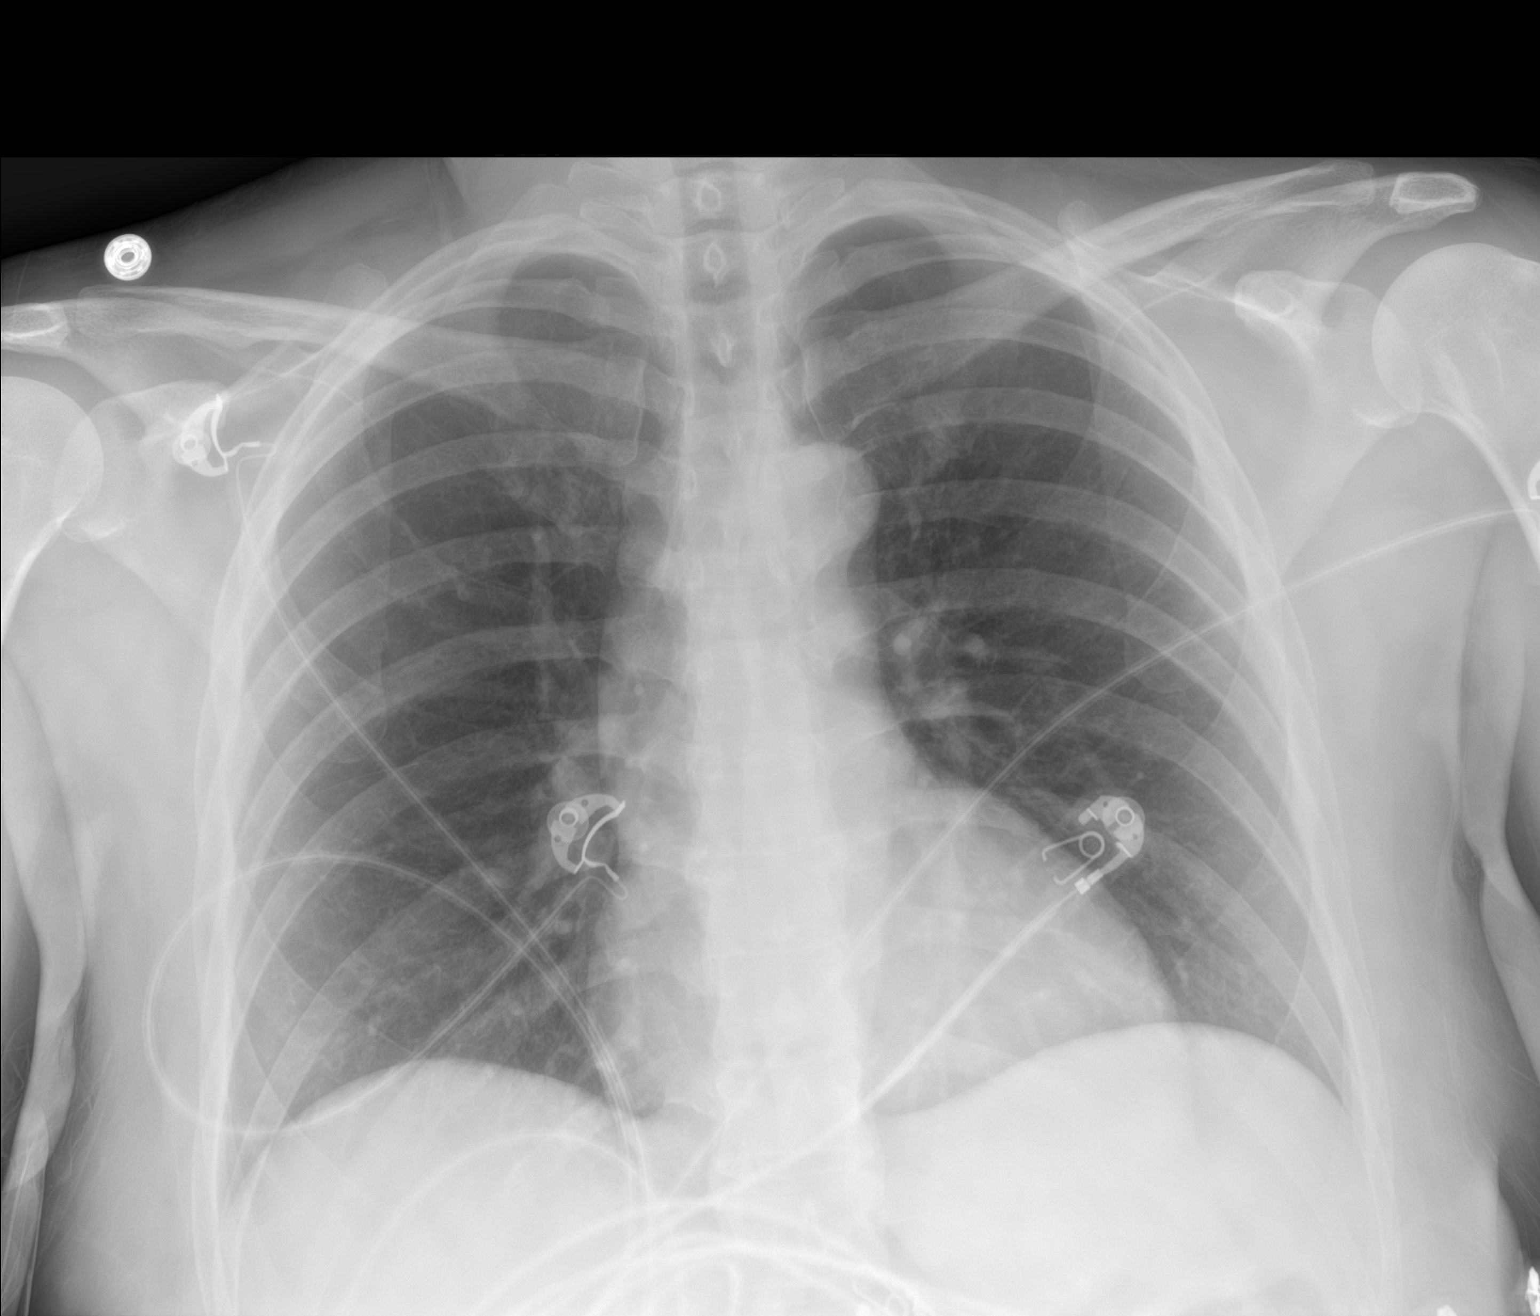

[1 of 1 positions shown; findings below may reference images not displayed]

FINDINGS: Lungs clear. Heart size normal. No pneumothorax or pleural fluid. No
acute or focal bony abnormality.
IMPRESSION: Negative chest.

## 2024-06-09 ENCOUNTER — Other Ambulatory Visit: Payer: Self-pay | Admitting: Medical Genetics

## 2024-06-11 ENCOUNTER — Other Ambulatory Visit: Payer: Self-pay | Admitting: Family Medicine

## 2024-06-11 ENCOUNTER — Encounter: Payer: Self-pay | Admitting: Family Medicine

## 2024-06-11 DIAGNOSIS — Z1231 Encounter for screening mammogram for malignant neoplasm of breast: Secondary | ICD-10-CM

## 2024-06-11 DIAGNOSIS — N6312 Unspecified lump in the right breast, upper inner quadrant: Secondary | ICD-10-CM

## 2024-06-11 NOTE — Progress Notes (Signed)
 Pt w/o mammogram for 5 years. States prior was abnormal but did not go for follow up testing and was lost to follow-up. Now w/ insurance and wanting to resume screening/diagnostic testing.   Mammogram and US  ordered.

## 2024-06-30 ENCOUNTER — Other Ambulatory Visit

## 2024-06-30 ENCOUNTER — Encounter

## 2024-07-07 ENCOUNTER — Ambulatory Visit

## 2024-07-07 ENCOUNTER — Ambulatory Visit
Admission: RE | Admit: 2024-07-07 | Discharge: 2024-07-07 | Disposition: A | Discharge status: Home / Self Care | Source: Ambulatory Visit | Attending: Family Medicine | Admitting: Family Medicine

## 2024-07-07 DIAGNOSIS — N6312 Unspecified lump in the right breast, upper inner quadrant: Secondary | ICD-10-CM

## 2024-08-15 ENCOUNTER — Other Ambulatory Visit: Payer: Self-pay | Admitting: Medical Genetics

## 2024-08-15 DIAGNOSIS — Z006 Encounter for examination for normal comparison and control in clinical research program: Secondary | ICD-10-CM

## 2024-09-03 ENCOUNTER — Other Ambulatory Visit: Payer: Self-pay | Admitting: Family Medicine

## 2024-09-22 ENCOUNTER — Other Ambulatory Visit: Payer: Self-pay

## 2024-09-22 ENCOUNTER — Other Ambulatory Visit (HOSPITAL_BASED_OUTPATIENT_CLINIC_OR_DEPARTMENT_OTHER): Payer: Self-pay

## 2024-09-22 MED ORDER — AMPHETAMINE-DEXTROAMPHET ER 20 MG PO CP24
20.0000 mg | ORAL_CAPSULE | Freq: Two times a day (BID) | ORAL | 0 refills | Status: AC | PRN
  Filled 2024-09-22: qty 60, 30d supply, fill #0

## 2024-09-27 ENCOUNTER — Ambulatory Visit: Admitting: Family Medicine

## 2024-09-27 ENCOUNTER — Other Ambulatory Visit (HOSPITAL_BASED_OUTPATIENT_CLINIC_OR_DEPARTMENT_OTHER): Payer: Self-pay

## 2024-11-01 ENCOUNTER — Other Ambulatory Visit (HOSPITAL_BASED_OUTPATIENT_CLINIC_OR_DEPARTMENT_OTHER): Payer: Self-pay

## 2024-11-01 MED ORDER — GABAPENTIN 100 MG PO CAPS
100.0000 mg | ORAL_CAPSULE | Freq: Three times a day (TID) | ORAL | 0 refills | Status: AC
  Filled 2024-11-01: qty 90, 30d supply, fill #0

## 2024-11-01 MED ORDER — DOXYCYCLINE HYCLATE 100 MG PO TABS
100.0000 mg | ORAL_TABLET | Freq: Every day | ORAL | 0 refills | Status: AC
  Filled 2024-11-01: qty 30, 30d supply, fill #0

## 2024-11-09 ENCOUNTER — Other Ambulatory Visit (HOSPITAL_BASED_OUTPATIENT_CLINIC_OR_DEPARTMENT_OTHER): Payer: Self-pay
